# Patient Record
Sex: Female | Born: 1955 | Race: White | Hispanic: No | Smoking: Never smoker
Health system: Southern US, Community
[De-identification: ages and names within clinical notes are randomized; demographics above are authoritative.]

## PROBLEM LIST (undated history)

## (undated) DIAGNOSIS — T8859XA Other complications of anesthesia, initial encounter: Secondary | ICD-10-CM

## (undated) HISTORY — PX: THYROID SURGERY: SHX805

## (undated) HISTORY — PX: OOPHORECTOMY: SHX86

## (undated) HISTORY — PX: THYROIDECTOMY: SHX17

---

## 2017-04-05 DIAGNOSIS — E039 Hypothyroidism, unspecified: Secondary | ICD-10-CM | POA: Insufficient documentation

## 2017-04-05 DIAGNOSIS — H04123 Dry eye syndrome of bilateral lacrimal glands: Secondary | ICD-10-CM | POA: Insufficient documentation

## 2017-04-05 DIAGNOSIS — F419 Anxiety disorder, unspecified: Secondary | ICD-10-CM | POA: Insufficient documentation

## 2017-04-05 DIAGNOSIS — J309 Allergic rhinitis, unspecified: Secondary | ICD-10-CM | POA: Insufficient documentation

## 2017-04-05 DIAGNOSIS — E785 Hyperlipidemia, unspecified: Secondary | ICD-10-CM | POA: Insufficient documentation

## 2017-07-08 DIAGNOSIS — E663 Overweight: Secondary | ICD-10-CM | POA: Insufficient documentation

## 2017-12-06 DIAGNOSIS — F410 Panic disorder [episodic paroxysmal anxiety] without agoraphobia: Secondary | ICD-10-CM | POA: Insufficient documentation

## 2017-12-06 DIAGNOSIS — Z9989 Dependence on other enabling machines and devices: Secondary | ICD-10-CM | POA: Insufficient documentation

## 2017-12-06 DIAGNOSIS — Z0000 Encounter for general adult medical examination without abnormal findings: Secondary | ICD-10-CM | POA: Insufficient documentation

## 2017-12-06 DIAGNOSIS — G4733 Obstructive sleep apnea (adult) (pediatric): Secondary | ICD-10-CM | POA: Insufficient documentation

## 2017-12-06 DIAGNOSIS — E890 Postprocedural hypothyroidism: Secondary | ICD-10-CM | POA: Insufficient documentation

## 2021-05-04 ENCOUNTER — Encounter: Payer: Medicare Other | Admitting: Family Medicine

## 2021-05-04 ENCOUNTER — Other Ambulatory Visit (HOSPITAL_COMMUNITY)
Admission: RE | Admit: 2021-05-04 | Discharge: 2021-05-04 | Disposition: A | Discharge status: Home / Self Care | Payer: Medicare Other | Source: Ambulatory Visit | Attending: Family Medicine | Admitting: Family Medicine

## 2021-05-04 ENCOUNTER — Other Ambulatory Visit: Payer: Self-pay

## 2021-05-04 ENCOUNTER — Encounter: Payer: Self-pay | Admitting: Family Medicine

## 2021-05-04 ENCOUNTER — Ambulatory Visit (INDEPENDENT_AMBULATORY_CARE_PROVIDER_SITE_OTHER): Payer: Medicare Other | Admitting: Family Medicine

## 2021-05-04 VITALS — BP 124/78 | HR 73 | Wt 154.0 lb

## 2021-05-04 DIAGNOSIS — Z1151 Encounter for screening for human papillomavirus (HPV): Secondary | ICD-10-CM | POA: Insufficient documentation

## 2021-05-04 DIAGNOSIS — Z124 Encounter for screening for malignant neoplasm of cervix: Secondary | ICD-10-CM

## 2021-05-04 DIAGNOSIS — R102 Pelvic and perineal pain: Secondary | ICD-10-CM | POA: Diagnosis not present

## 2021-05-04 DIAGNOSIS — Z01419 Encounter for gynecological examination (general) (routine) without abnormal findings: Secondary | ICD-10-CM | POA: Insufficient documentation

## 2021-05-04 DIAGNOSIS — N952 Postmenopausal atrophic vaginitis: Secondary | ICD-10-CM | POA: Diagnosis not present

## 2021-05-04 MED ORDER — PREMARIN 0.625 MG/GM VA CREA
1.0000 | TOPICAL_CREAM | Freq: Every day | VAGINAL | 12 refills | Status: DC
Start: 2021-05-04 — End: 2023-03-12

## 2021-05-04 NOTE — Progress Notes (Signed)
° °  Subjective:    Patient ID: Christine Richardson is a 65 y.o. female presenting with No chief complaint on file.  on 05/04/2021  HPI: Had some pain with intercourse. Felt like knives. Uses baby oil as lubricant.  Feels it at introitus. Unsure if worse with standing. Thought it might be pelvic organ prolapse. Felt somewhat swollen at the time.  Review of Systems  Constitutional:  Negative for chills and fever.  Respiratory:  Negative for shortness of breath.   Cardiovascular:  Negative for chest pain.  Gastrointestinal:  Negative for abdominal pain, nausea and vomiting.  Genitourinary:  Negative for dysuria.  Skin:  Negative for rash.     Objective:    BP 124/78    Pulse 73    Wt 154 lb (69.9 kg)  Physical Exam Constitutional:      General: She is not in acute distress.    Appearance: She is well-developed.  HENT:     Head: Normocephalic and atraumatic.  Eyes:     General: No scleral icterus. Cardiovascular:     Rate and Rhythm: Normal rate.  Pulmonary:     Effort: Pulmonary effort is normal.  Abdominal:     Palpations: Abdomen is soft.  Genitourinary:    Comments: BUS normal, vagina is pale and atrophic, cervix is without lesion, uterus is small and anteverted, no adnexal mass or tenderness. There is good support. Examined upright and supine.   Musculoskeletal:     Cervical back: Neck supple.  Skin:    General: Skin is warm and dry.  Neurological:     Mental Status: She is alert and oriented to person, place, and time.        Assessment & Plan:  Pelvic pain - no evidence of Pelvic organ prolapse - check u/s - Plan: US PELVIC COMPLETE WITH TRANSVAGINAL  Vaginal atrophy - trial of premarin, water based lubricant - Plan: conjugated estrogens (PREMARIN) vaginal cream  Screening for cervical cancer - Should be last pap needed - Plan: Cytology - PAP( [place])  Return in about 7 weeks (around 06/22/2021) for needs U/S.  Reva Bores 05/04/2021 11:28 AM

## 2021-05-06 LAB — CYTOLOGY - PAP
Comment: NEGATIVE
Diagnosis: NEGATIVE
High risk HPV: NEGATIVE

## 2021-05-09 ENCOUNTER — Telehealth: Payer: Self-pay | Admitting: *Deleted

## 2021-05-09 NOTE — Telephone Encounter (Signed)
-----   Message from Reva Bores, MD sent at 05/09/2021  8:57 AM EST ----- Her pap is normal.

## 2021-05-09 NOTE — Telephone Encounter (Signed)
Pt informed of normal pap results.  ?

## 2021-05-13 ENCOUNTER — Ambulatory Visit
Admission: RE | Admit: 2021-05-13 | Discharge: 2021-05-13 | Disposition: A | Discharge status: Home / Self Care | Payer: Medicare Other | Source: Ambulatory Visit | Attending: Family Medicine | Admitting: Family Medicine

## 2021-05-13 ENCOUNTER — Other Ambulatory Visit: Payer: Self-pay

## 2021-05-13 DIAGNOSIS — R102 Pelvic and perineal pain: Secondary | ICD-10-CM | POA: Diagnosis not present

## 2021-05-17 ENCOUNTER — Telehealth: Payer: Self-pay | Admitting: Radiology

## 2021-05-17 NOTE — Telephone Encounter (Signed)
Called and left voicemail to call [facility] to schedule appointment with Dr Shawnie Pons to discuss ultrasound results.

## 2021-05-17 NOTE — Telephone Encounter (Signed)
-----   Message from Clovis Riley, RN sent at 05/17/2021 10:03 AM EST ----- Do you mind calling her to get her in with pratt ----- Message ----- From: Reva Bores, MD Sent: 05/16/2021  11:28 AM EST To: [facility]  Needs appointment to discuss results please

## 2021-05-18 ENCOUNTER — Other Ambulatory Visit: Payer: Self-pay

## 2021-05-18 ENCOUNTER — Encounter: Payer: Self-pay | Admitting: Family Medicine

## 2021-05-18 ENCOUNTER — Other Ambulatory Visit: Payer: Self-pay | Admitting: Family Medicine

## 2021-05-18 ENCOUNTER — Ambulatory Visit: Payer: Medicare Other | Admitting: Family Medicine

## 2021-05-18 ENCOUNTER — Ambulatory Visit (INDEPENDENT_AMBULATORY_CARE_PROVIDER_SITE_OTHER): Payer: Medicare Other | Admitting: Family Medicine

## 2021-05-18 VITALS — BP 119/78 | HR 82

## 2021-05-18 DIAGNOSIS — R9389 Abnormal findings on diagnostic imaging of other specified body structures: Secondary | ICD-10-CM

## 2021-05-19 ENCOUNTER — Encounter: Payer: Self-pay | Admitting: Family Medicine

## 2021-05-19 DIAGNOSIS — R9389 Abnormal findings on diagnostic imaging of other specified body structures: Secondary | ICD-10-CM | POA: Insufficient documentation

## 2021-05-19 NOTE — Assessment & Plan Note (Signed)
S/p EMB will treat based on results Patient given post procedure instructions.

## 2021-05-19 NOTE — Progress Notes (Signed)
° °  Subjective:    Patient ID: Christine Richardson is a 65 y.o. female presenting with Follow-up  on 05/18/2021  HPI: Patient had significant pain with intercourse and came in to be seen. Noted to have vaginal dryness and placed on Premarin. Had u/s for pain as part of w/u and found to have thickened endometrium.  Review of Systems  Constitutional:  Negative for chills and fever.  Respiratory:  Negative for shortness of breath.   Cardiovascular:  Negative for chest pain.  Gastrointestinal:  Negative for abdominal pain, nausea and vomiting.  Genitourinary:  Negative for dysuria.  Skin:  Negative for rash.     Objective:    BP 119/78    Pulse 82  Physical Exam Constitutional:      General: She is not in acute distress.    Appearance: She is well-developed.  HENT:     Head: Normocephalic and atraumatic.  Eyes:     General: No scleral icterus. Cardiovascular:     Rate and Rhythm: Normal rate.  Pulmonary:     Effort: Pulmonary effort is normal.  Abdominal:     Palpations: Abdomen is soft.  Genitourinary:    Comments: BUS normal, vagina is pink and rugated, cervix has a small cervical polyp noted uterus is small and anteverted, no adnexal mass or tenderness.  Musculoskeletal:     Cervical back: Neck supple.  Skin:    General: Skin is warm and dry.  Neurological:     Mental Status: She is alert and oriented to person, place, and time.   Procedure: Patient given informed consent, signed copy in the chart, time out was performed. Appropriate time out taken. . The patient was placed in the lithotomy position and the cervix brought into view with sterile speculum.  Portio of cervix cleansed x 2 with betadine swabs.  A tenaculum was placed in the anterior lip of the cervix.  The uterus was sounded for depth of 7 cm. A pipelle was introduced to into the uterus, suction created,  and an endometrial sample was obtained. All equipment was removed and accounted for.  The patient tolerated the  procedure well.       Assessment & Plan:   Problem List Items Addressed This Visit       Unprioritized   Thickened endometrium - Primary    S/p EMB will treat based on results Patient given post procedure instructions.        Relevant Orders   Surgical pathology( / [facility])    Return in about 4 weeks (around 06/15/2021) for virtual.  Reva Bores 05/19/2021 8:37 AM

## 2021-05-24 ENCOUNTER — Telehealth: Payer: Self-pay | Admitting: *Deleted

## 2021-05-24 NOTE — Telephone Encounter (Signed)
Pt informed of EMB results

## 2021-05-24 NOTE — Telephone Encounter (Signed)
-----   Message from Reva Bores, MD sent at 05/24/2021  3:57 PM EST ----- Her biopsy is consistent with a benign endometrial polyp.

## 2021-07-20 ENCOUNTER — Ambulatory Visit (INDEPENDENT_AMBULATORY_CARE_PROVIDER_SITE_OTHER): Payer: Medicare Other | Admitting: Family Medicine

## 2021-07-20 ENCOUNTER — Encounter: Payer: Self-pay | Admitting: Family Medicine

## 2021-07-20 ENCOUNTER — Other Ambulatory Visit: Payer: Self-pay

## 2021-07-20 DIAGNOSIS — N952 Postmenopausal atrophic vaginitis: Secondary | ICD-10-CM | POA: Diagnosis not present

## 2021-07-20 DIAGNOSIS — R9389 Abnormal findings on diagnostic imaging of other specified body structures: Secondary | ICD-10-CM

## 2021-07-20 NOTE — Assessment & Plan Note (Signed)
On Premarin, vagina appears well supported. ?

## 2021-07-20 NOTE — Progress Notes (Signed)
? ?  Subjective:  ? ? Patient ID: Christine Richardson is a 66 y.o. female presenting with Follow-up ? on 07/20/2021 ? ?HPI: ?Here for f/u. Had initially come in for dyspareunia, thought to be related to vaginal atrophy. U/s done showed thickened endometrium immediately after that visit. Biopsy done showed benign polyp. Using the premarin cream and no more painful intercourse, but now with some intermittent spotting. ? ?Review of Systems  ?Constitutional:  Negative for chills and fever.  ?Respiratory:  Negative for shortness of breath.   ?Cardiovascular:  Negative for chest pain.  ?Gastrointestinal:  Negative for abdominal pain, nausea and vomiting.  ?Genitourinary:  Negative for dysuria.  ?Skin:  Negative for rash.  ?   ?Objective:  ?  ?BP 125/83   Pulse 62   Resp 16   Ht 5' 3.5" (1.613 m)   Wt 154 lb (69.9 kg)   BMI 26.85 kg/m?  ?Physical Exam ?Exam conducted with a chaperone present.  ?Constitutional:   ?   General: She is not in acute distress. ?   Appearance: She is well-developed.  ?HENT:  ?   Head: Normocephalic and atraumatic.  ?Eyes:  ?   General: No scleral icterus. ?Cardiovascular:  ?   Rate and Rhythm: Normal rate.  ?Pulmonary:  ?   Effort: Pulmonary effort is normal.  ?Abdominal:  ?   Palpations: Abdomen is soft.  ?Genitourinary: ?   Comments: Vagina is pink ?Musculoskeletal:  ?   Cervical back: Neck supple.  ?Skin: ?   General: Skin is warm and dry.  ?Neurological:  ?   Mental Status: She is alert and oriented to person, place, and time.  ? ? ? ?   ?Assessment & Plan:  ? ?Problem List Items Addressed This Visit   ? ?  ? Unprioritized  ? Vaginal atrophy  ?  On Premarin, vagina appears well supported. ?  ?  ? Thickened endometrium  ?  Intermittent spotting, if persists, consider surgical intervention. Recommend decreasing premairn to 2x/wk. ?  ?  ? ? ? ?Return in about 3 months (around 10/20/2021). ? ?Reva Bores, MD ?07/20/2021 ?11:03 AM ? ? ?

## 2021-07-20 NOTE — Assessment & Plan Note (Signed)
Intermittent spotting, if persists, consider surgical intervention. Recommend decreasing premairn to 2x/wk. ?

## 2021-09-01 DIAGNOSIS — M25511 Pain in right shoulder: Secondary | ICD-10-CM | POA: Insufficient documentation

## 2021-09-01 DIAGNOSIS — M25611 Stiffness of right shoulder, not elsewhere classified: Secondary | ICD-10-CM | POA: Insufficient documentation

## 2021-10-27 ENCOUNTER — Other Ambulatory Visit: Payer: Self-pay | Admitting: Family Medicine

## 2021-10-27 DIAGNOSIS — Z780 Asymptomatic menopausal state: Secondary | ICD-10-CM

## 2021-10-28 ENCOUNTER — Other Ambulatory Visit: Payer: Self-pay | Admitting: Family Medicine

## 2021-10-28 DIAGNOSIS — Z1231 Encounter for screening mammogram for malignant neoplasm of breast: Secondary | ICD-10-CM

## 2021-10-28 DIAGNOSIS — Z780 Asymptomatic menopausal state: Secondary | ICD-10-CM

## 2021-10-31 ENCOUNTER — Other Ambulatory Visit: Payer: Self-pay | Admitting: Family Medicine

## 2021-10-31 ENCOUNTER — Other Ambulatory Visit (HOSPITAL_COMMUNITY): Payer: Self-pay | Admitting: Family Medicine

## 2021-10-31 DIAGNOSIS — E785 Hyperlipidemia, unspecified: Secondary | ICD-10-CM

## 2021-11-16 ENCOUNTER — Ambulatory Visit
Admission: RE | Admit: 2021-11-16 | Discharge: 2021-11-16 | Disposition: A | Discharge status: Home / Self Care | Payer: Medicare Other | Source: Ambulatory Visit | Attending: Family Medicine | Admitting: Family Medicine

## 2021-11-16 DIAGNOSIS — E785 Hyperlipidemia, unspecified: Secondary | ICD-10-CM | POA: Diagnosis present

## 2021-12-28 ENCOUNTER — Other Ambulatory Visit: Payer: Medicare Other

## 2022-02-16 ENCOUNTER — Ambulatory Visit
Admission: RE | Admit: 2022-02-16 | Discharge: 2022-02-16 | Disposition: A | Discharge status: Home / Self Care | Payer: Medicare Other | Source: Ambulatory Visit | Attending: Family Medicine | Admitting: Family Medicine

## 2022-02-16 DIAGNOSIS — Z780 Asymptomatic menopausal state: Secondary | ICD-10-CM | POA: Diagnosis present

## 2022-02-16 DIAGNOSIS — Z1231 Encounter for screening mammogram for malignant neoplasm of breast: Secondary | ICD-10-CM | POA: Insufficient documentation

## 2022-02-22 ENCOUNTER — Inpatient Hospital Stay
Admission: RE | Admit: 2022-02-22 | Discharge: 2022-02-22 | Disposition: A | Discharge status: Home / Self Care | Payer: Self-pay | Source: Ambulatory Visit | Attending: *Deleted | Admitting: *Deleted

## 2022-02-22 ENCOUNTER — Other Ambulatory Visit: Payer: Self-pay | Admitting: *Deleted

## 2022-02-22 DIAGNOSIS — Z1231 Encounter for screening mammogram for malignant neoplasm of breast: Secondary | ICD-10-CM

## 2022-11-25 IMAGING — US US PELVIS COMPLETE WITH TRANSVAGINAL
1 series · 13 of 25 positions shown · non-contrast
Comparison: None

CLINICAL DATA: Pelvic pain for 6 weeks



[Series 1: us pelvic complete with transvaginal · 61 acquisitions, 13 frames shown]
[im 1/61]
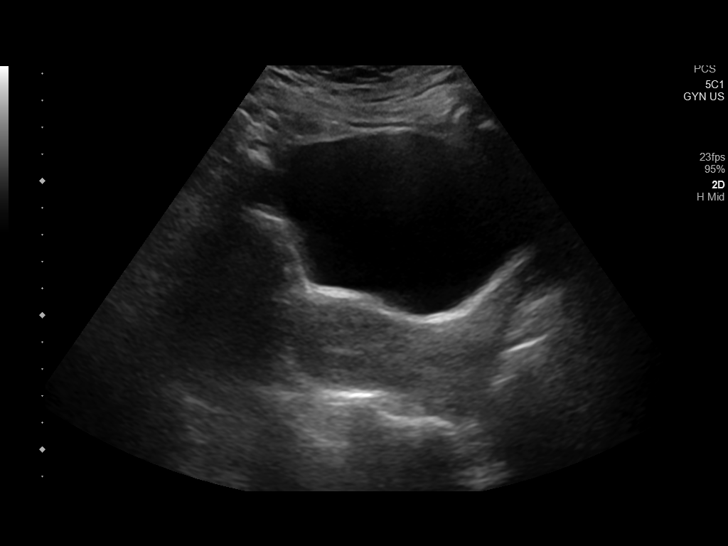
[im 6/61]
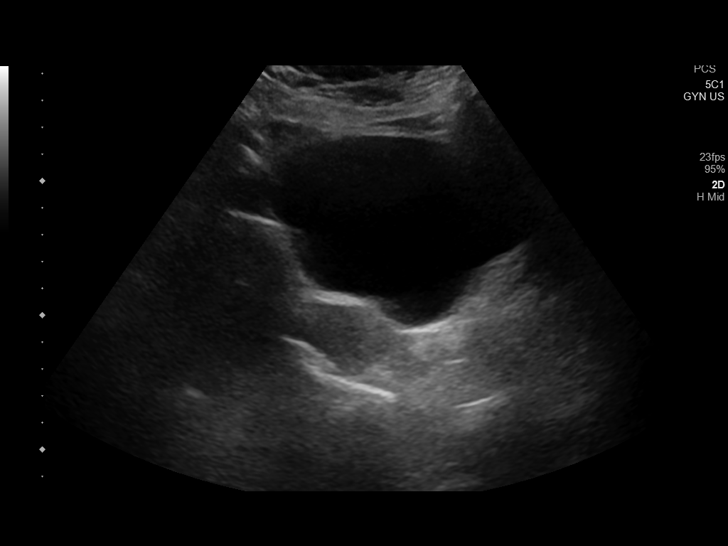
[im 11/61]
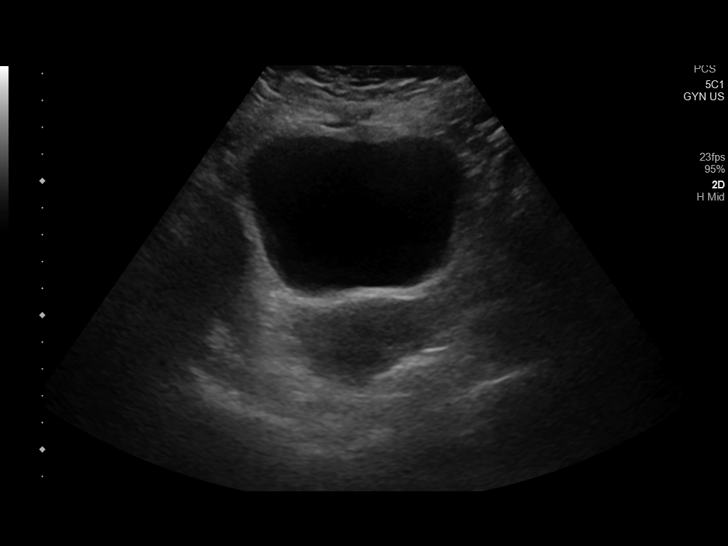
[im 16/61]
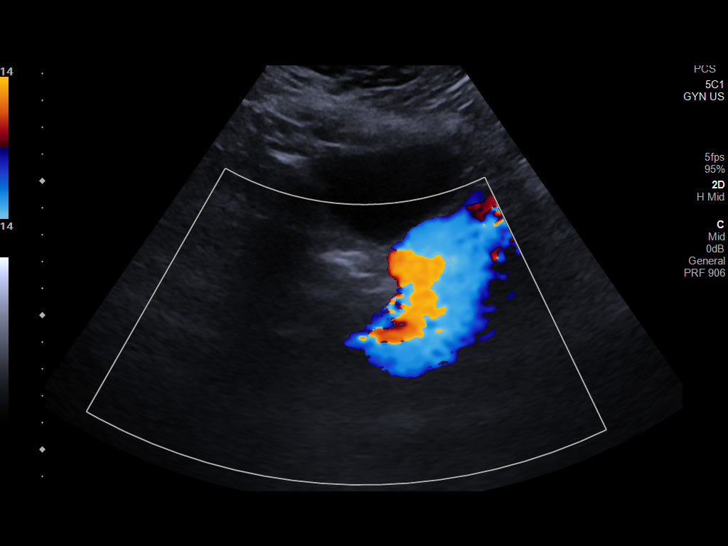
[im 21/61]
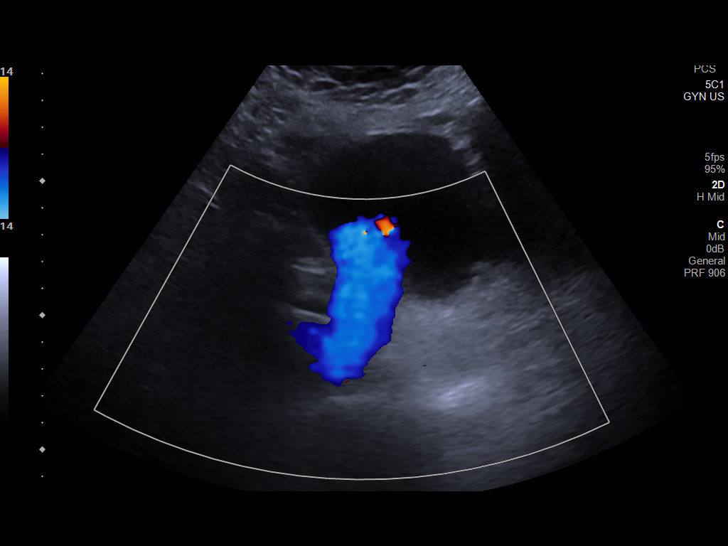
[im 26/61]
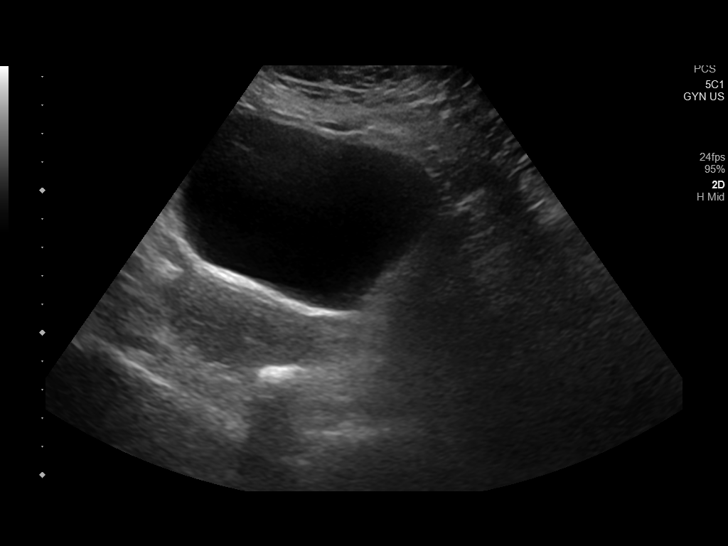
[im 31/61]
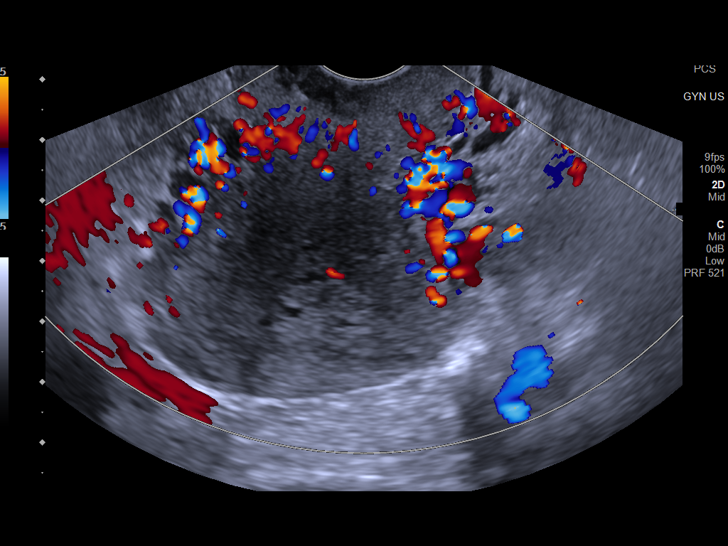
[im 36/61]
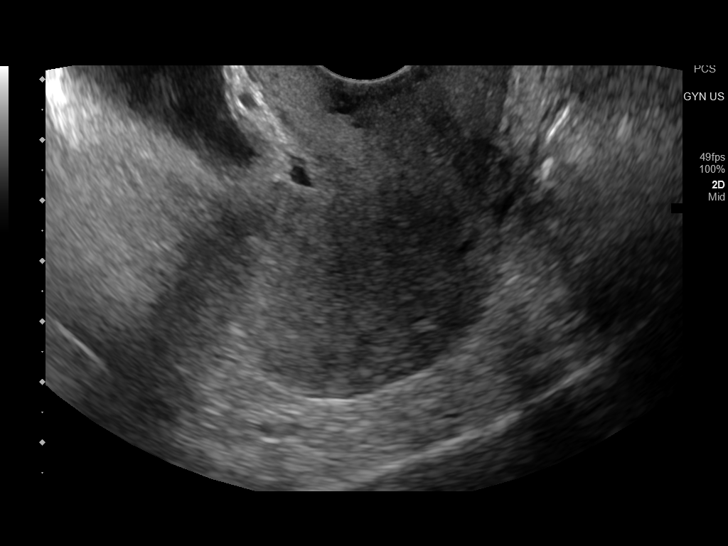
[im 41/61]
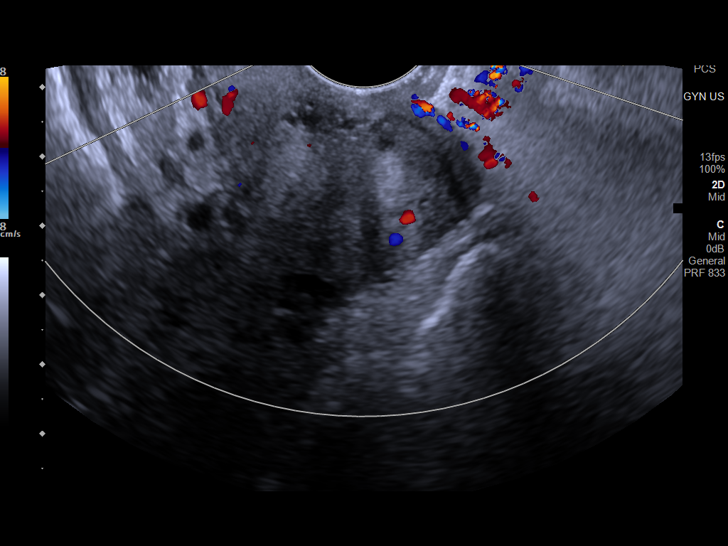
[im 46/61]
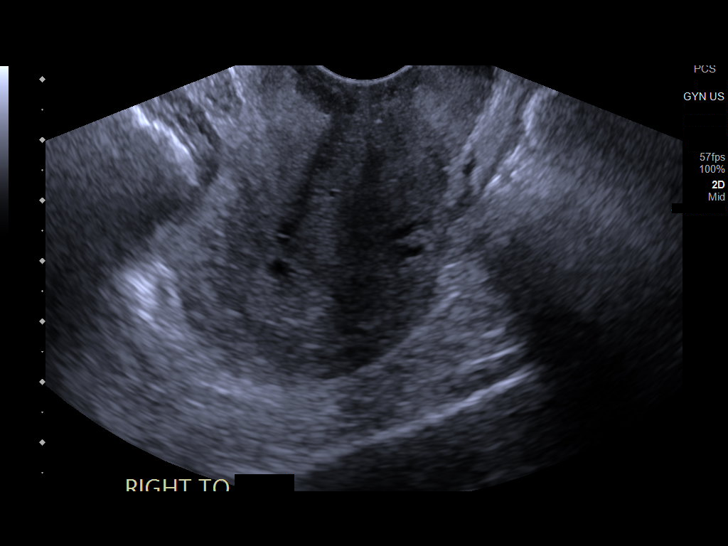
[im 51/61]
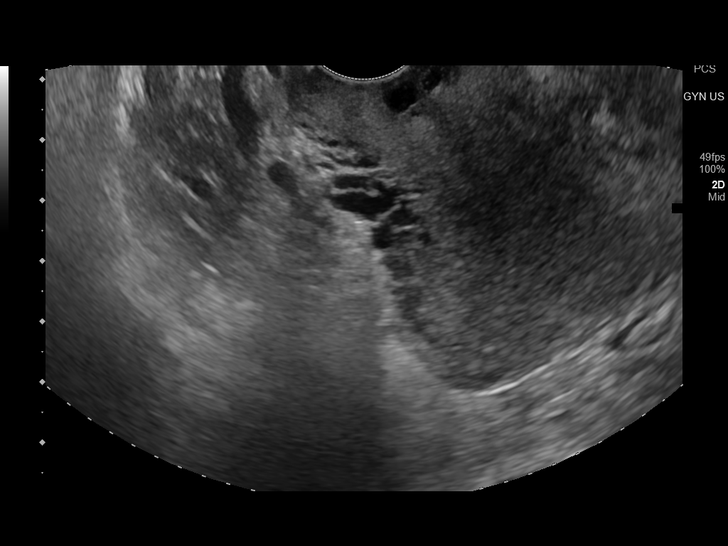
[im 56/61]
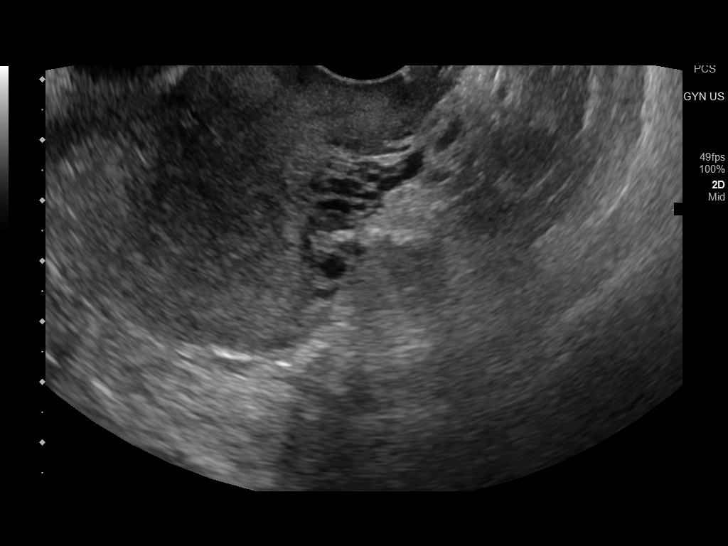
[im 61/61]
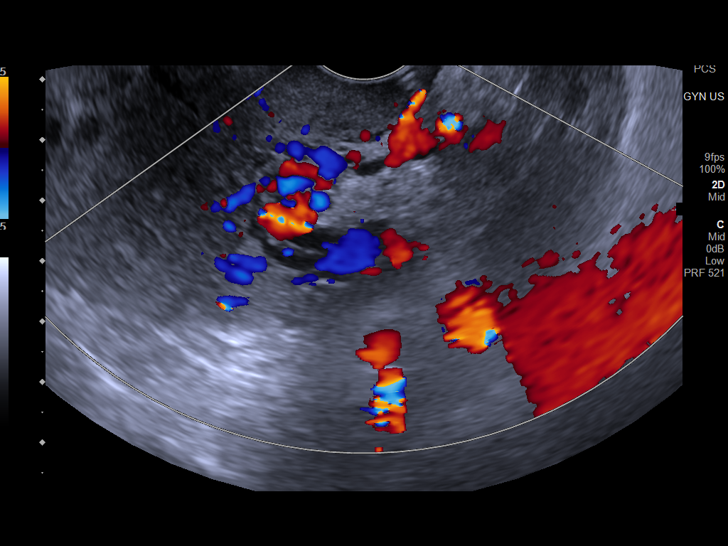

[13 of 25 positions shown; findings below may reference images not displayed]

FINDINGS: Uterus

Measurements: 7.0 x 3.6 x 4.8 cm. = volume: 63 mL. No fibroids or
other mass visualized.

Endometrium

Thickness: 12.8 mm. A complex hypoechoic region is noted in the
endometrium greater than that expected for a patient of this age.
Some color flow is noted along the peripheral aspect of the
endometrial abnormality.

Right ovary

Surgically removed

Left ovary

Not well visualized

Other findings

No abnormal free fluid.
IMPRESSION: Prominent endometrium at 12.8 mm with a complex predominately
hypoechoic area identified with some color flow along its edge.
Endometrial thickness is considered abnormal for an asymptomatic
post-menopausal female. Endometrial sampling should be considered to
exclude carcinoma.

No other focal abnormality is noted.

These results will be called to the ordering clinician or
representative by the Radiologist Assistant, and communication
documented in the PACS or [REDACTED].

## 2023-03-12 ENCOUNTER — Ambulatory Visit (INDEPENDENT_AMBULATORY_CARE_PROVIDER_SITE_OTHER): Payer: Medicare Other | Admitting: Physician Assistant

## 2023-03-12 ENCOUNTER — Encounter: Payer: Self-pay | Admitting: Physician Assistant

## 2023-03-12 VITALS — BP 92/57 | HR 71 | Temp 97.7°F | Ht 63.5 in | Wt 157.6 lb

## 2023-03-12 DIAGNOSIS — R933 Abnormal findings on diagnostic imaging of other parts of digestive tract: Secondary | ICD-10-CM

## 2023-03-12 DIAGNOSIS — R197 Diarrhea, unspecified: Secondary | ICD-10-CM | POA: Diagnosis not present

## 2023-03-12 MED ORDER — PEG 3350-KCL-NA BICARB-NACL 420 G PO SOLR: 4000.0000 mL | Freq: Once | ORAL | 0 refills | Status: AC

## 2023-03-12 NOTE — Progress Notes (Signed)
Celso Amy, PA-C [address]  [address]  [place], [place] [address]  Main: [phone]  Fax: [phone]   Gastroenterology Consultation  Referring Provider:     Orpha Bur, MD Primary Care Physician:  Orpha Bur, MD Primary Gastroenterologist:  Celso Amy, PA-C / Dr. Midge Minium   Reason for Consultation:     Diarrhea        HPI:   Christine Richardson is a 67 y.o. y/o female referred for consultation & management  by Orpha Bur, MD.    CT abdomen pelvis with contrast done 03/05/2023 (to evaluate RLQ pain and diarrhea) showed questionable mild proximal colitis which could be infectious or inflammatory.  Mild: Stool burden.  Normal appendix.  GI follow-up was recommended.  Patient states she has had diarrhea for several months.  She has had extensive workup from her PCP including negative stool studies, and normal labs including negative celiac labs.  Patient states her PCP recommended that she have a colonoscopy.  Patient denies rectal bleeding or unintentional weight loss.  Admits to mild weight gain.  Has mild intermittent lower abdominal cramping which comes and goes.  No current abdominal pain today.  She denies any recent antibiotic use.  Diarrhea started several months ago after she was started on Zetia for cholesterol.  Patient took Zetia for 7weeks and then discontinued, which has not helped her diarrhea.  She is having 3 or 4 loose bowel movements every morning.  She eats very healthy diet.  She reports her last colonoscopy done in 2020 in [place] showed 1 small polyp removed.  She reports getting colonoscopy every 5 years in [place].  No personal history or family history of IBD.  Her father had celiac.  Sister passed away from liver cancer.  Her mother had colon cancer in her 41s.  History reviewed. No pertinent past medical history.  Past Surgical History:  Procedure Laterality Date   OOPHORECTOMY Right    THYROID SURGERY     THYROIDECTOMY      Prior  to Admission medications   Medication Sig Start Date End Date Taking? Authorizing Provider  conjugated estrogens (PREMARIN) vaginal cream Place 1 Applicatorful vaginally daily. 05/04/21   Reva Bores, MD  cycloSPORINE (RESTASIS) 0.05 % ophthalmic emulsion Place 1 drop into both eyes 2 (two) times daily.    [provider]  [facility] Lanius (OMEGA-3) 500 MG CAPS Take by mouth.    [provider]  levothyroxine (SYNTHROID) 75 MCG tablet Take 75 mcg by mouth daily before breakfast.    [provider]  vitamin B-12 (CYANOCOBALAMIN) 1000 MCG tablet Take 1,000 mcg by mouth daily.    [provider]  Vitamin D, Cholecalciferol, 25 MCG (1000 UT) CAPS Take by mouth.    [provider]    Family History  Problem Relation Age of Onset   Colon cancer Mother    Rheum arthritis Mother      Social History   Tobacco Use   Smoking status: Never   Smokeless tobacco: Never  Vaping Use   Vaping status: Never Used  Substance Use Topics   Alcohol use: Yes    Alcohol/week: 1.0 standard drink of alcohol    Types: 1 Glasses of wine per week   Drug use: Never    Allergies as of 03/12/2023 - Review Complete 03/12/2023  Allergen Reaction Noted   Penicillins Rash, Hives, and Other (See Comments) 05/04/2021   Sulfate Rash 05/04/2021    Review of Systems:  All systems reviewed and negative except where noted in HPI.   Physical Exam:  BP (!) 92/57   Pulse 71   Temp 97.7 F (36.5 C)   Ht 5' 3.5" (1.613 m)   Wt 157 lb 9.6 oz (71.5 kg)   BMI 27.48 kg/m  No LMP recorded. Patient is postmenopausal.  General:   Alert,  Well-developed, well-nourished, pleasant and cooperative in NAD Lungs:  Respirations even and unlabored.  Clear throughout to auscultation.   No wheezes, crackles, or rhonchi. No acute distress. Heart:  Regular rate and rhythm; no murmurs, clicks, rubs, or gallops. Abdomen:  Normal bowel sounds.  No bruits.  Soft, and non-distended without  masses, hepatosplenomegaly or hernias noted.  No Tenderness.  No guarding or rebound tenderness.    Neurologic:  Alert and oriented x3;  grossly normal neurologically. Psych:  Alert and cooperative. Normal mood and affect.  Imaging Studies: No results found.  Assessment and Plan:   Aerith Carreira is a 67 y.o. y/o female has been referred for:   1.  Diarrhea  Requesting recent stool test results and lab results from her PCP.  Patient states everything was normal.  Recent negative stool studies and negative celiac labs.  2.  Abnormal CT scan of the colon 03/05/2023: Questionable mild proximal colitis  Scheduling Colonoscopy I discussed risks of colonoscopy with patient to include risk of bleeding, colon perforation, and risk of sedation.  Patient expressed understanding and agrees to proceed with colonoscopy.   Follow up As Needed based on Colonoscopy results and GI symptoms.  Celso Amy, PA-C

## 2023-03-13 ENCOUNTER — Telehealth: Payer: Self-pay | Admitting: Physician Assistant

## 2023-03-13 NOTE — Telephone Encounter (Signed)
Medical records request were sent  to [facility] from [place] [facility] 03/13/2023 All notes and labs

## 2023-04-04 ENCOUNTER — Encounter: Payer: Self-pay | Admitting: Gastroenterology

## 2023-04-04 ENCOUNTER — Other Ambulatory Visit: Payer: Self-pay

## 2023-04-10 ENCOUNTER — Encounter: Payer: Self-pay | Admitting: Gastroenterology

## 2023-04-11 ENCOUNTER — Encounter: Payer: Self-pay | Admitting: Gastroenterology

## 2023-04-11 ENCOUNTER — Other Ambulatory Visit: Payer: Self-pay

## 2023-04-11 ENCOUNTER — Ambulatory Visit
Admission: RE | Admit: 2023-04-11 | Discharge: 2023-04-11 | Disposition: A | Discharge status: Home / Self Care | Payer: Medicare Other | Attending: Gastroenterology | Admitting: Gastroenterology

## 2023-04-11 ENCOUNTER — Ambulatory Visit: Payer: Medicare Other | Admitting: Anesthesiology

## 2023-04-11 ENCOUNTER — Encounter
Admission: RE | Disposition: A | Discharge status: Home / Self Care | Payer: Self-pay | Source: Home / Self Care | Attending: Gastroenterology

## 2023-04-11 DIAGNOSIS — K529 Noninfective gastroenteritis and colitis, unspecified: Secondary | ICD-10-CM | POA: Insufficient documentation

## 2023-04-11 DIAGNOSIS — F419 Anxiety disorder, unspecified: Secondary | ICD-10-CM | POA: Insufficient documentation

## 2023-04-11 DIAGNOSIS — E890 Postprocedural hypothyroidism: Secondary | ICD-10-CM | POA: Insufficient documentation

## 2023-04-11 DIAGNOSIS — Z800 Family history of malignant neoplasm of digestive organs: Secondary | ICD-10-CM | POA: Insufficient documentation

## 2023-04-11 DIAGNOSIS — R197 Diarrhea, unspecified: Secondary | ICD-10-CM

## 2023-04-11 HISTORY — DX: Other complications of anesthesia, initial encounter: T88.59XA

## 2023-04-11 HISTORY — PX: BIOPSY: SHX5522

## 2023-04-11 HISTORY — PX: COLONOSCOPY WITH PROPOFOL: SHX5780

## 2023-04-11 SURGERY — COLONOSCOPY WITH PROPOFOL
Anesthesia: General

## 2023-04-11 MED ORDER — PROPOFOL 10 MG/ML IV BOLUS
INTRAVENOUS | Status: AC
  Filled 2023-04-11: qty 20

## 2023-04-11 MED ORDER — PROPOFOL 1000 MG/100ML IV EMUL
INTRAVENOUS | Status: AC
  Filled 2023-04-11: qty 100

## 2023-04-11 MED ORDER — LIDOCAINE HCL (CARDIAC) PF 100 MG/5ML IV SOSY
PREFILLED_SYRINGE | INTRAVENOUS | Status: DC | PRN
  Administered 2023-04-11: 50 mg via INTRAVENOUS

## 2023-04-11 MED ORDER — SODIUM CHLORIDE 0.9 % IV SOLN: INTRAVENOUS | Status: DC

## 2023-04-11 MED ORDER — PROPOFOL 10 MG/ML IV BOLUS
INTRAVENOUS | Status: DC | PRN
  Administered 2023-04-11: 80 mg via INTRAVENOUS
  Administered 2023-04-11: 120 ug/kg/min via INTRAVENOUS

## 2023-04-11 NOTE — Transfer of Care (Signed)
Immediate Anesthesia Transfer of Care Note  Patient: Christine Richardson  Procedure(s) Performed: COLONOSCOPY WITH PROPOFOL BIOPSY  Patient Location: PACU and Endoscopy Unit  Anesthesia Type:General  Level of Consciousness: drowsy and patient cooperative  Airway & Oxygen Therapy: Patient Spontanous Breathing  Post-op Assessment: Report given to RN and Post -op Vital signs reviewed and stable  Post vital signs: Reviewed and stable  Last Vitals:  Vitals Value Taken Time  BP 95/64 04/11/23 0948  Temp 35.6 C 04/11/23 0948  Pulse 63 04/11/23 0952  Resp 19 04/11/23 0952  SpO2 98 % 04/11/23 0952  Vitals shown include unfiled device data.  Last Pain:  Vitals:   04/11/23 0948  TempSrc: Temporal  PainSc:          Complications: No notable events documented.

## 2023-04-11 NOTE — Anesthesia Preprocedure Evaluation (Signed)
Anesthesia Evaluation  Patient identified by MRN, date of birth, ID band Patient awake    Reviewed: Allergy & Precautions, NPO status , Patient's Chart, lab work & pertinent test results  Airway Mallampati: III  TM Distance: >3 FB Neck ROM: full    Dental  (+) Chipped   Pulmonary neg pulmonary ROS   Pulmonary exam normal        Cardiovascular negative cardio ROS Normal cardiovascular exam     Neuro/Psych  PSYCHIATRIC DISORDERS Anxiety     negative neurological ROS     GI/Hepatic negative GI ROS, Neg liver ROS,,,  Endo/Other  Hypothyroidism    Renal/GU negative Renal ROS  negative genitourinary   Musculoskeletal   Abdominal   Peds  Hematology negative hematology ROS (+)   Anesthesia Other Findings Past Medical History: No date: Complication of anesthesia  Past Surgical History: No date: OOPHORECTOMY; Right No date: THYROID SURGERY No date: THYROIDECTOMY  BMI    Body Mass Index: 27.28 kg/m      Reproductive/Obstetrics negative OB ROS                             Anesthesia Physical Anesthesia Plan  ASA: 2  Anesthesia Plan: General   Post-op Pain Management: Minimal or no pain anticipated   Induction: Intravenous  PONV Risk Score and Plan: 3 and Propofol infusion, TIVA and Ondansetron  Airway Management Planned: Nasal Cannula  Additional Equipment: None  Intra-op Plan:   Post-operative Plan:   Informed Consent: I have reviewed the patients History and Physical, chart, labs and discussed the procedure including the risks, benefits and alternatives for the proposed anesthesia with the patient or authorized representative who has indicated his/her understanding and acceptance.     Dental advisory given  Plan Discussed with: CRNA and Surgeon  Anesthesia Plan Comments: (Discussed risks of anesthesia with patient, including possibility of difficulty with spontaneous  ventilation under anesthesia necessitating airway intervention, PONV, and rare risks such as cardiac or respiratory or neurological events, and allergic reactions. Discussed the role of CRNA in patient's perioperative care. Patient understands.)       Anesthesia Quick Evaluation

## 2023-04-11 NOTE — Op Note (Signed)
[facility] Gastroenterology Patient Name: Christine Richardson Procedure Date: 04/11/2023 9:22 AM MRN: [id number] Account #: [id number] Date of Birth: 01-12-1956 Admit Type: Outpatient Age: 67 Room: [facility] [place] [facility] ENDO ROOM 4 Gender: Female Note Status: Finalized Instrument Name: Prentice Docker [id number] Procedure:             Colonoscopy Indications:           Chronic diarrhea Providers:             Midge Minium MD, MD Referring MD:          No Local Md, MD (Referring MD) Medicines:             Propofol per Anesthesia Complications:         No immediate complications. Procedure:             Pre-Anesthesia Assessment:                        - Prior to the procedure, a History and Physical was                         performed, and patient medications and allergies were                         reviewed. The patient's tolerance of previous                         anesthesia was also reviewed. The risks and benefits                         of the procedure and the sedation options and risks                         were discussed with the patient. All questions were                         answered, and informed consent was obtained. Prior                         Anticoagulants: The patient has taken no anticoagulant                         or antiplatelet agents. ASA Grade Assessment: II - A                         patient with mild systemic disease. After reviewing                         the risks and benefits, the patient was deemed in                         satisfactory condition to undergo the procedure.                        After obtaining informed consent, the colonoscope was                         passed under direct vision. Throughout the procedure,  the patient's blood pressure, pulse, and oxygen                         saturations were monitored continuously. The                         Colonoscope was introduced through the anus and                          advanced to the the terminal ileum. The colonoscopy                         was performed without difficulty. The patient                         tolerated the procedure well. The quality of the bowel                         preparation was excellent. Findings:      The perianal and digital rectal examinations were normal.      The terminal ileum appeared normal. Biopsies were taken with a cold       forceps for histology.      The colon (entire examined portion) appeared normal. Biopsies were taken       with a cold forceps for histology. Impression:            - The examined portion of the ileum was normal.                         Biopsied.                        - The entire examined colon is normal. Biopsied. Recommendation:        - Discharge patient to home.                        - Resume previous diet.                        - Continue present medications.                        - Await pathology results. Procedure Code(s):     --- Professional ---                        [phone], Colonoscopy, flexible; with biopsy, single or                         multiple Diagnosis Code(s):     --- Professional ---                        K52.9, Noninfective gastroenteritis and colitis,                         unspecified CPT copyright 2022 American Medical Association. All rights reserved. The codes documented in this report are preliminary and upon coder review may  be revised to meet current compliance requirements. Midge Minium MD, MD 04/11/2023 9:46:08 AM This report has been signed electronically. Number of Addenda: 0  Note Initiated On: 04/11/2023 9:22 AM Scope Withdrawal Time: 0 hours 8 minutes 43 seconds  Total Procedure Duration: 0 hours 16 minutes 4 seconds  Estimated Blood Loss:  Estimated blood loss: none.      [facility]

## 2023-04-11 NOTE — Anesthesia Postprocedure Evaluation (Signed)
Anesthesia Post Note  Patient: Christine Richardson  Procedure(s) Performed: COLONOSCOPY WITH PROPOFOL BIOPSY  Patient location during evaluation: PACU Anesthesia Type: General Level of consciousness: awake and alert Pain management: pain level controlled Vital Signs Assessment: post-procedure vital signs reviewed and stable Respiratory status: spontaneous breathing, nonlabored ventilation, respiratory function stable and patient connected to nasal cannula oxygen Cardiovascular status: blood pressure returned to baseline and stable Postop Assessment: no apparent nausea or vomiting Anesthetic complications: no  No notable events documented.   Last Vitals:  Vitals:   04/11/23 0948 04/11/23 0958  BP: 95/64 111/72  Pulse: 68 65  Resp: 18 (!) 21  Temp: (!) 35.6 C   SpO2: 96% 98%    Last Pain:  Vitals:   04/11/23 0958  TempSrc:   PainSc: 0-No pain                 Stephanie Coup

## 2023-04-11 NOTE — H&P (Signed)
Midge Minium, MD [facility] [address] [place], [place] [address] Phone:[phone] Fax : [phone]  Primary Care Physician:  Orpha Bur, MD Primary Gastroenterologist:  Dr. Servando Snare  Pre-Procedure History & Physical: HPI:  Christine Richardson is a 67 y.o. female is here for an colonoscopy.   Past Medical History:  Diagnosis Date   Complication of anesthesia     Past Surgical History:  Procedure Laterality Date   OOPHORECTOMY Right    THYROID SURGERY     THYROIDECTOMY      Prior to Admission medications   Medication Sig Start Date End Date Taking? Authorizing Provider  Boris Lown Oil (OMEGA-3) 500 MG CAPS Take by mouth.   Yes [provider]  levothyroxine (SYNTHROID) 75 MCG tablet Take 75 mcg by mouth daily before breakfast.   Yes [provider]  progesterone (ENDOMETRIN) 100 MG vaginal insert Place 100 mg vaginally 2 (two) times daily.   Yes [provider]  traZODone (DESYREL) 100 MG tablet Take 100 mg by mouth at bedtime.   Yes [provider]  vitamin B-12 (CYANOCOBALAMIN) 1000 MCG tablet Take 1,000 mcg by mouth daily.   Yes [provider]  Vitamin D, Cholecalciferol, 25 MCG (1000 UT) CAPS Take by mouth.   Yes [provider]    Allergies as of 03/12/2023 - Review Complete 03/12/2023  Allergen Reaction Noted   Penicillins Rash, Hives, and Other (See Comments) 05/04/2021   Sulfate Rash 05/04/2021    Family History  Problem Relation Age of Onset   Colon cancer Mother    Rheum arthritis Mother     Social History   Socioeconomic History   Marital status: Unknown    Spouse name: Not on file   Number of children: Not on file   Years of education: Not on file   Highest education level: Not on file  Occupational History   Not on file  Tobacco Use   Smoking status: Never   Smokeless tobacco: Never  Vaping Use   Vaping status: Never Used  Substance and Sexual Activity   Alcohol use: Yes    Alcohol/week:  1.0 standard drink of alcohol    Types: 1 Glasses of wine per week   Drug use: Never   Sexual activity: Yes    Partners: Male  Other Topics Concern   Not on file  Social History Narrative   Not on file   Social Determinants of Health   Financial Resource Strain: Not on file  Food Insecurity: Not on file  Transportation Needs: Not on file  Physical Activity: Not on file  Stress: Not on file  Social Connections: Not on file  Intimate Partner Violence: Not on file    Review of Systems: See HPI, otherwise negative ROS  Physical Exam: BP 112/77   Pulse 77   Temp (!) 97 F (36.1 C) (Temporal)   Ht 5\' 3"  (1.6 m)   Wt 69.9 kg   SpO2 100%   BMI 27.28 kg/m  General:   Alert,  pleasant and cooperative in NAD Head:  Normocephalic and atraumatic. Neck:  Supple; no masses or thyromegaly. Lungs:  Clear throughout to auscultation.    Heart:  Regular rate and rhythm. Abdomen:  Soft, nontender and nondistended. Normal bowel sounds, without guarding, and without rebound.   Neurologic:  Alert and  oriented x4;  grossly normal neurologically.  Impression/Plan: Christine Richardson is here for an colonoscopy to be performed for diarrhea  Risks, benefits, limitations, and alternatives regarding  colonoscopy  have been reviewed with the patient.  Questions have been answered.  All parties agreeable.   Midge Minium, MD  04/11/2023, 9:15 AM

## 2023-04-12 ENCOUNTER — Encounter: Payer: Self-pay | Admitting: Gastroenterology

## 2023-04-16 LAB — SURGICAL PATHOLOGY

## 2023-04-23 ENCOUNTER — Other Ambulatory Visit
Admission: RE | Admit: 2023-04-23 | Discharge: 2023-04-23 | Disposition: A | Discharge status: Home / Self Care | Payer: Self-pay | Source: Ambulatory Visit | Attending: Medical Genetics | Admitting: Medical Genetics

## 2023-04-23 ENCOUNTER — Other Ambulatory Visit: Payer: Self-pay | Admitting: Medical Genetics

## 2023-04-25 ENCOUNTER — Other Ambulatory Visit: Payer: Self-pay | Admitting: Medical Genetics

## 2023-04-30 ENCOUNTER — Other Ambulatory Visit: Payer: Self-pay

## 2023-05-08 LAB — GENECONNECT MOLECULAR SCREEN: Genetic Analysis Overall Interpretation: NEGATIVE

## 2023-10-17 ENCOUNTER — Other Ambulatory Visit: Payer: Self-pay

## 2023-10-17 ENCOUNTER — Emergency Department

## 2023-10-17 ENCOUNTER — Emergency Department
Admission: EM | Admit: 2023-10-17 | Discharge: 2023-10-18 | Disposition: A | Discharge status: Home / Self Care | Attending: Emergency Medicine | Admitting: Emergency Medicine

## 2023-10-17 DIAGNOSIS — R5383 Other fatigue: Secondary | ICD-10-CM

## 2023-10-17 DIAGNOSIS — R079 Chest pain, unspecified: Secondary | ICD-10-CM

## 2023-10-17 DIAGNOSIS — R2681 Unsteadiness on feet: Secondary | ICD-10-CM | POA: Insufficient documentation

## 2023-10-17 DIAGNOSIS — E079 Disorder of thyroid, unspecified: Secondary | ICD-10-CM | POA: Insufficient documentation

## 2023-10-17 DIAGNOSIS — R0602 Shortness of breath: Secondary | ICD-10-CM | POA: Diagnosis not present

## 2023-10-17 DIAGNOSIS — J341 Cyst and mucocele of nose and nasal sinus: Secondary | ICD-10-CM | POA: Insufficient documentation

## 2023-10-17 DIAGNOSIS — R519 Headache, unspecified: Secondary | ICD-10-CM

## 2023-10-17 DIAGNOSIS — R42 Dizziness and giddiness: Secondary | ICD-10-CM | POA: Diagnosis not present

## 2023-10-17 LAB — LIPASE, BLOOD: Lipase: 33 U/L (ref 11–51)

## 2023-10-17 LAB — BASIC METABOLIC PANEL WITH GFR
Anion gap: 11 (ref 5–15)
BUN: 15 mg/dL (ref 8–23)
CO2: 27 mmol/L (ref 22–32)
Calcium: 9.5 mg/dL (ref 8.9–10.3)
Chloride: 105 mmol/L (ref 98–111)
Creatinine, Ser: 0.76 mg/dL (ref 0.44–1.00)
GFR, Estimated: >60 mL/min (ref >60)
Glucose, Bld: 99 mg/dL (ref 70–99)
Potassium: 4.3 mmol/L (ref 3.5–5.1)
Sodium: 143 mmol/L (ref 135–145)

## 2023-10-17 LAB — URINALYSIS, W/ REFLEX TO CULTURE (INFECTION SUSPECTED)
Bilirubin Urine: NEGATIVE
Glucose, UA: NEGATIVE mg/dL
Hgb urine dipstick: NEGATIVE
Ketones, ur: NEGATIVE mg/dL
Leukocytes,Ua: NEGATIVE
Nitrite: NEGATIVE
Protein, ur: NEGATIVE mg/dL
Specific Gravity, Urine: 1.033 — ABNORMAL HIGH (ref 1.005–1.030)
pH: 6 (ref 5.0–8.0)

## 2023-10-17 LAB — CBC
HCT: 42.6 % (ref 36.0–46.0)
Hemoglobin: 14.7 g/dL (ref 12.0–15.0)
MCH: 30.9 pg (ref 26.0–34.0)
MCHC: 34.5 g/dL (ref 30.0–36.0)
MCV: 89.5 fL (ref 80.0–100.0)
Platelets: 226 10*3/uL (ref 150–400)
RBC: 4.76 MIL/uL (ref 3.87–5.11)
RDW: 11.9 % (ref 11.5–15.5)
WBC: 7.2 10*3/uL (ref 4.0–10.5)
nRBC: 0 % (ref 0.0–0.2)

## 2023-10-17 LAB — TSH: TSH: 2.245 u[IU]/mL (ref 0.350–4.500)

## 2023-10-17 LAB — HEPATIC FUNCTION PANEL
ALT: 25 U/L (ref 0–44)
AST: 23 U/L (ref 15–41)
Albumin: 4.1 g/dL (ref 3.5–5.0)
Alkaline Phosphatase: 57 U/L (ref 38–126)
Bilirubin, Direct: <0.1 mg/dL (ref 0.0–0.2)
Total Bilirubin: 0.7 mg/dL (ref 0.0–1.2)
Total Protein: 6.8 g/dL (ref 6.5–8.1)

## 2023-10-17 LAB — T4, FREE: Free T4: 1.17 ng/dL — ABNORMAL HIGH (ref 0.61–1.12)

## 2023-10-17 LAB — TROPONIN I (HIGH SENSITIVITY)
Troponin I (High Sensitivity): 3 ng/L (ref <18)
Troponin I (High Sensitivity): 3 ng/L (ref <18)

## 2023-10-17 MED ORDER — IOHEXOL 350 MG/ML SOLN
75.0000 mL | Freq: Once | INTRAVENOUS | Status: AC | PRN
  Administered 2023-10-17: 75 mL via INTRAVENOUS

## 2023-10-17 MED ORDER — SODIUM CHLORIDE 0.9 % IV BOLUS
1000.0000 mL | Freq: Once | INTRAVENOUS | Status: AC
  Administered 2023-10-17: 1000 mL via INTRAVENOUS

## 2023-10-17 MED ORDER — MECLIZINE HCL 25 MG PO TABS
25.0000 mg | ORAL_TABLET | Freq: Once | ORAL | Status: AC
Start: 2023-10-17 — End: 2023-10-17
  Administered 2023-10-17: 25 mg via ORAL
  Filled 2023-10-17: qty 1

## 2023-10-17 NOTE — ED Triage Notes (Signed)
 Pt to Ed via POV from home. Pt reports CP with exertion over the last several weeks that has been more persistent over the last few days. Pt reports left sided CP. Pt denies SOB. Pt also states intermittent HA and light headed.

## 2023-10-17 NOTE — ED Provider Notes (Signed)
 11:42 PM  Assumed care at shift change.

## 2023-10-17 NOTE — ED Notes (Signed)
 Patient transported to MRI

## 2023-10-17 NOTE — ED Notes (Signed)
 See triage notes. Patient c/o intermittent chest pain with exertion over the last several weeks. Patient just returned from three weeks in [place].

## 2023-10-17 NOTE — ED Notes (Signed)
 Mumma, MD notified this RN that she had got patient up to walk her around the room and the patient had a new onset of gait instability. Patient states that this is a new symptom that just started around 2000 today while she was in the ED. No weakness or facial droop observed at this time. Will continue to monitor.

## 2023-10-17 NOTE — ED Provider Notes (Signed)
 [facility] Provider Note    Event Date/Time   First MD Initiated Contact with Patient 10/17/23 1812     (approximate)   History   Chest Pain   HPI  Christine Richardson is a 68 y.o. female past medical history significant for thyroid disorder, who presents to the emergency department for not feeling well.  Patient states that she has been having exertional chest pain and shortness of breath over the past couple of weeks but has significantly worsened over the past day.  States that she just recently got back from a trip to [place].  Denies any swelling on her legs.  Denies any history of heart issues and has never had a stress test before.  No history of hypertension or hyperlipidemia.  No tobacco use.  No alcohol use.  Not on any OCPs but does take progesterone at nighttime for sleep.  States that she has been feeling very tired and fatigued and rundown.  Denies any dysuria, urinary urgency or frequency.  Denies fever or chills.     Physical Exam   Triage Vital Signs: ED Triage Vitals  Encounter Vitals Group     BP 10/17/23 1556 (!) 144/82     Systolic BP Percentile --      Diastolic BP Percentile --      Pulse Rate 10/17/23 1556 82     Resp 10/17/23 1559 16     Temp 10/17/23 1556 98.3 F (36.8 C)     Temp Source 10/17/23 1556 Oral     SpO2 10/17/23 1556 100 %     Weight --      Height --      Head Circumference --      Peak Flow --      Pain Score 10/17/23 1557 4     Pain Loc --      Pain Education --      Exclude from Growth Chart --     Most recent vital signs: Vitals:   10/17/23 2130 10/17/23 2200  BP: (!) 141/81 135/64  Pulse: (!) 45 68  Resp: 15 18  Temp:    SpO2: 98% 96%    Physical Exam Constitutional:      Appearance: She is well-developed.  HENT:     Head: Atraumatic.  Eyes:     Extraocular Movements: Extraocular movements intact.     Conjunctiva/sclera: Conjunctivae normal.     Pupils: Pupils are equal, round, and reactive to  light.     Comments: No nystagmus  Cardiovascular:     Rate and Rhythm: Regular rhythm.     Heart sounds: Normal heart sounds.  Pulmonary:     Effort: No respiratory distress.  Abdominal:     General: There is no distension.     Palpations: Abdomen is soft.     Tenderness: There is no abdominal tenderness.  Musculoskeletal:        General: Normal range of motion.     Cervical back: Normal range of motion.     Right lower leg: No tenderness. No edema.     Left lower leg: No edema.  Skin:    General: Skin is warm.     Capillary Refill: Capillary refill takes less than 2 seconds.  Neurological:     Mental Status: She is alert. Mental status is at baseline.     GCS: GCS eye subscore is 4. GCS verbal subscore is 5. GCS motor subscore is 6.     Cranial  Nerves: Cranial nerves 2-12 are intact.     Sensory: Sensation is intact.     Motor: Motor function is intact.     Coordination: Coordination is intact.     Gait: Gait abnormal.     IMPRESSION / MDM / ASSESSMENT AND PLAN / ED COURSE  I reviewed the triage vital signs and the nursing notes.  Differential diagnosis include ACS, anemia, hypothyroidism, dehydration, pulmonary embolism, infectious process, pneumonia  Does endorse a mild headache, denies any change in vision.  Has a nonfocal neurologic exam low suspicion for acute CVA.  EKG  I, Viviano Ground, the attending physician, personally viewed and interpreted this ECG.   Rate: Normal  Rhythm: Normal sinus  Axis: Normal  Intervals: Normal  ST&T Change: None  No tachycardic or bradycardic dysrhythmias while on cardiac telemetry.  RADIOLOGY I independently reviewed imaging, my interpretation of imaging: CTA with no signs of pulmonary embolism.  No signs of pneumonia.  No pulmonary edema.  CT scan of the head with no signs of intracranial hemorrhage or infarction.  LABS (all labs ordered are listed, but only abnormal results are displayed) Labs interpreted as -     Labs Reviewed  T4, FREE - Abnormal; Notable for the following components:      Result Value   Free T4 1.17 (*)    All other components within normal limits  URINALYSIS, W/ REFLEX TO CULTURE (INFECTION SUSPECTED) - Abnormal; Notable for the following components:   Color, Urine STRAW (*)    APPearance CLEAR (*)    Specific Gravity, Urine 1.033 (*)    Bacteria, UA RARE (*)    All other components within normal limits  BASIC METABOLIC PANEL WITH GFR  CBC  HEPATIC FUNCTION PANEL  LIPASE, BLOOD  TSH  TROPONIN I (HIGH SENSITIVITY)  TROPONIN I (HIGH SENSITIVITY)     MDM    Lab work overall reassuring.  Low risk heart score.  Serial troponins are negative have low suspicion for ACS.  No signs of pulmonary embolism or pneumonia.  Lab work is overall unremarkable.  Repeat vital signs patient is hypertensive with a blood pressure of 160/92 which is new for her.  Attempted to ambulate the patient and patient was feeling very off balance and dizzy and unable to walk in a straight line.  Orthostatic blood pressures were negative.  Continues to complain of ongoing severe fatigue.  Given her gait instability, hypertension and fatigue will further workup for acute CVA with MRI of the brain MRA.  On reevaluation repeat blood pressure significantly improved.  Patient does state that she is feeling better than she was previously.  Discussed with the MRI is negative and her gait has returned back to normal that she could follow-up closely as an outpatient with primary care physician and consider outpatient stress testing.  States that she has a follow-up appointment on Monday with her primary care physician but that she could call tomorrow to see if they can move up the appointment till tomorrow or Friday.  Discussed if ongoing gait instability would admit for further evaluation.  Care transferred to incoming provider with MRI pending   PROCEDURES:  Critical Care performed:  No  Procedures  Patient's presentation is most consistent with acute presentation with potential threat to life or bodily function.   MEDICATIONS ORDERED IN ED: Medications  iohexol (OMNIPAQUE) 350 MG/ML injection 75 mL (75 mLs Intravenous Contrast Given 10/17/23 1939)  sodium chloride  0.9 % bolus 1,000 mL (0 mLs Intravenous Stopped 10/17/23  2143)    FINAL CLINICAL IMPRESSION(S) / ED DIAGNOSES   Final diagnoses:  Chest pain, unspecified type  Dizzy  Nonintractable headache, unspecified chronicity pattern, unspecified headache type  Other fatigue     Rx / DC Orders   ED Discharge Orders     None        Note:  This document was prepared using Dragon voice recognition software and may include unintentional dictation errors.   Viviano Ground, MD 10/17/23 4098    Viviano Ground, MD 10/18/23 [id number]

## 2023-10-18 MED ORDER — ONDANSETRON 4 MG PO TBDP: 4.0000 mg | ORAL_TABLET | Freq: Four times a day (QID) | ORAL | 0 refills | Status: DC | PRN

## 2023-10-18 MED ORDER — MECLIZINE HCL 25 MG PO TABS: 25.0000 mg | ORAL_TABLET | Freq: Three times a day (TID) | ORAL | 0 refills | Status: AC | PRN

## 2023-10-18 NOTE — ED Notes (Signed)
 Patient tolerated PO challenge and ambulated to the bathroom with minimal assistance.

## 2023-10-18 NOTE — Discharge Instructions (Addendum)
 Your workup today has been reassuring with reassuring labs, normal CTA of the chest that rules out blood clots, normal MRI of the brain which showed no aneurysm or stroke.  I recommend rest, increase fluid intake and close follow-up with your primary care doctor in the next 1 to 2 days.  You may take over-the-counter Tylenol 1000 mg every 6 hours as needed for pain.  I have sent prescriptions for nausea medicine and medicine for dizziness to your pharmacy.

## 2023-10-24 ENCOUNTER — Other Ambulatory Visit: Payer: Self-pay | Admitting: Family Medicine

## 2023-10-24 DIAGNOSIS — R9389 Abnormal findings on diagnostic imaging of other specified body structures: Secondary | ICD-10-CM

## 2023-10-30 ENCOUNTER — Ambulatory Visit
Admission: RE | Admit: 2023-10-30 | Discharge: 2023-10-30 | Disposition: A | Discharge status: Home / Self Care | Source: Ambulatory Visit | Attending: Family Medicine | Admitting: Family Medicine

## 2023-10-30 DIAGNOSIS — R9389 Abnormal findings on diagnostic imaging of other specified body structures: Secondary | ICD-10-CM | POA: Insufficient documentation

## 2023-11-14 NOTE — Progress Notes (Signed)
 Evaluate for dissection and acute stroke/large vessel cutoff

## 2023-11-20 ENCOUNTER — Other Ambulatory Visit: Payer: Self-pay | Admitting: Family Medicine

## 2023-11-20 DIAGNOSIS — E785 Hyperlipidemia, unspecified: Secondary | ICD-10-CM

## 2023-11-22 DIAGNOSIS — R600 Localized edema: Secondary | ICD-10-CM | POA: Insufficient documentation

## 2023-11-27 DIAGNOSIS — G5701 Lesion of sciatic nerve, right lower limb: Secondary | ICD-10-CM | POA: Insufficient documentation

## 2023-11-29 ENCOUNTER — Ambulatory Visit
Admission: RE | Admit: 2023-11-29 | Discharge: 2023-11-29 | Disposition: A | Discharge status: Home / Self Care | Source: Ambulatory Visit | Attending: Family Medicine | Admitting: Family Medicine

## 2023-11-29 DIAGNOSIS — E785 Hyperlipidemia, unspecified: Secondary | ICD-10-CM | POA: Diagnosis present

## 2023-12-19 DIAGNOSIS — M19011 Primary osteoarthritis, right shoulder: Secondary | ICD-10-CM | POA: Insufficient documentation

## 2023-12-19 DIAGNOSIS — M249 Joint derangement, unspecified: Secondary | ICD-10-CM | POA: Insufficient documentation

## 2024-05-26 ENCOUNTER — Other Ambulatory Visit: Payer: Self-pay

## 2024-05-26 DIAGNOSIS — H04129 Dry eye syndrome of unspecified lacrimal gland: Secondary | ICD-10-CM | POA: Insufficient documentation

## 2024-05-26 DIAGNOSIS — D126 Benign neoplasm of colon, unspecified: Secondary | ICD-10-CM | POA: Insufficient documentation

## 2024-05-26 DIAGNOSIS — K649 Unspecified hemorrhoids: Secondary | ICD-10-CM | POA: Insufficient documentation

## 2024-05-26 DIAGNOSIS — K5730 Diverticulosis of large intestine without perforation or abscess without bleeding: Secondary | ICD-10-CM | POA: Insufficient documentation

## 2024-05-26 DIAGNOSIS — Z800 Family history of malignant neoplasm of digestive organs: Secondary | ICD-10-CM | POA: Insufficient documentation

## 2024-05-30 ENCOUNTER — Ambulatory Visit: Admitting: Family Medicine

## 2024-06-05 ENCOUNTER — Ambulatory Visit: Admitting: Family Medicine

## 2024-06-05 ENCOUNTER — Encounter: Payer: Self-pay | Admitting: Family Medicine

## 2024-06-05 VITALS — BP 114/75 | HR 76 | Temp 98.2°F | Ht 63.25 in | Wt 153.6 lb

## 2024-06-05 DIAGNOSIS — K30 Functional dyspepsia: Secondary | ICD-10-CM

## 2024-06-05 DIAGNOSIS — R6881 Early satiety: Secondary | ICD-10-CM | POA: Diagnosis not present

## 2024-06-05 DIAGNOSIS — R140 Abdominal distension (gaseous): Secondary | ICD-10-CM

## 2024-06-05 DIAGNOSIS — R143 Flatulence: Secondary | ICD-10-CM

## 2024-06-05 DIAGNOSIS — R197 Diarrhea, unspecified: Secondary | ICD-10-CM

## 2024-06-05 NOTE — Progress Notes (Signed)
 "   06/05/2024 Christine Richardson [id number] 07/06/1955  Gastroenterology Office Note    Referring Provider: Cindie Poe, MD Primary Care Physician:  Cindie Poe, MD  Primary GI Provider: Celestia Rima, NP; Jinny Carmine, MD    Chief Complaint   Chief Complaint  Patient presents with   New Patient (Initial Visit)    Diarrhea has improved- SIBO dx -took zifaxin- took probiotics-some relief- still having difficulty on what she can eat-continues to have bloating-     History of Present Illness   Christine Richardson is a 69 y.o. female with PMHX of diarrhea, colitis presenting today for follow up.   Discussed the use of AI scribe software for clinical note transcription with the patient, who gave verbal consent to proceed.   Colitis was diagnosed in November 2024 following colonoscopy and treated with budesonide. Subsequent hydrogen breath testing in February 2025 confirmed small intestinal bacterial overgrowth, with repeat testing in March 2025 after Xifaxan showing persistent elevated methane levels despite improved hydrogen levels. Some symptomatic improvement occurred after antibiotics, but gastrointestinal symptoms persisted.  Dietary modifications included a low FODMAP diet with nutritionist guidance, avoidance of trigger foods such as apples, beans, red meat, and dairy, Lactase/lactose-free products are used for dairy intolerance. During the summer of 2025, symptoms improved, but by August 2025, diarrhea worsened and persisted for several months, including nocturnal episodes and frequent morning bowel movements that limited her ability to leave the house. Over-the-counter remedies such as Pepto Bismol, Gas-X, and probiotics were tried. Diarrhea has since slowed, with current bowel movements described as two soft, but not formed, stools daily. She denies blood in stool, melena, or significant weight loss. She is not diabetic and has not had recent surgeries.  Significant difficulty  digesting food persists, with frequent gas, bloating, and episodes of heartburn or sour stomach occurring four to five days per week, often triggered by eating more than small portions. Cramping associated with bloating is typically localized to the lower abdomen and buttocks, without true abdominal pain. Nausea and vomiting are absent. Omeprazole has not been used for six months; Tums are used as needed for heartburn.   Patient seen by Ellouise Console, PA on 03/12/2023 for diarrhea and abnormal CT scan of the colon, questionable mild proximal colitis. Treated for collagenous colitis with budesonide.    04/11/2023 colonoscopy - The examined portion of the ileum was normal. Biopsied. NO SIGNIFICANT ABNORMALITY.NEGATIVE FOR INTRAEPITHELIAL LYMPHOCYTES, VILLOUS ATROPHY OR MALIGNANCY   - The entire examined colon is normal. Biopsied. COLONIC MUCOSA WITH A THICKENED SUBEPITHELIAL COLLAGEN BAND, INCREASED       INTRAEPITHELIAL LYMPHOCYTES AND SURFACE EPITHELIAL INJURY, CONSISTENT WITH COLLAGENOUS COLITIS. 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     Krill Oil (OMEGA-3) 500 MG CAPS Take by mouth.     levothyroxine (SYNTHROID) 75 MCG tablet Take 75 mcg by mouth daily before breakfast.     meclizine  (ANTIVERT ) 25 MG tablet Take 1 tablet (25 mg total) by mouth 3 (three) times daily as needed. 30 tablet 0   progesterone (ENDOMETRIN) 100 MG vaginal insert Place 100 mg vaginally 2 (two) times daily.     traZODone (DESYREL) 100 MG tablet Take 100 mg by mouth at bedtime.     vitamin B-12 (CYANOCOBALAMIN) 1000 MCG tablet Take 1,000 mcg by mouth daily.     Vitamin D, Cholecalciferol, 25 MCG (1000 UT) CAPS Take by mouth.     No current facility-administered medications for this visit.    Allergies as of 06/05/2024 - Review Complete 06/05/2024  Allergen Reaction Noted   Penicillins Rash, Hives, and Other (See Comments) 05/04/2021   Sulfa antibiotics Other (See Comments) 12/06/2017   Sulfate Rash 05/04/2021    Family History  Problem Relation Age of Onset   Colon cancer Mother    Rheum arthritis Mother     Social History   Socioeconomic History   Marital status: Married    Spouse name: Not on file   Number of children: Not on file   Years of education: Not on file   Highest education level: Not on file  Occupational History   Not on file  Tobacco Use   Smoking status: Never   Smokeless tobacco: Never  Vaping Use   Vaping status: Never Used  Substance and Sexual Activity   Alcohol use: Yes    Alcohol/week: 1.0 standard drink of alcohol    Types: 1 Glasses of wine per week   Drug use: Never   Sexual activity: Yes    Partners: Male  Other Topics Concern   Not on file  Social History Narrative   Not on file   Social Drivers of Health   Tobacco Use: Low Risk  (06/05/2024)   Patient History    Smoking Tobacco Use: Never    Smokeless Tobacco Use: Never    Passive Exposure: Not on file  Financial Resource Strain: Low Risk  (11/27/2023)   Received from [facility]   Overall Financial Resource Strain (CARDIA)    Difficulty of Paying Living Expenses: Not hard at all  Food Insecurity: No Food Insecurity (11/27/2023)   Received from [facility]   Epic    Within the past 12 months, you worried that your food would run out before you got the money to buy more.: Never true    Within the past 12 months, the food you bought just didn't last and you didn't have money to get more.: Never true  Transportation Needs: No Transportation Needs (11/27/2023)   Received from [facility] - Transportation    In the past 12 months, has lack of transportation kept you from medical appointments or from getting medications?: No    Lack of Transportation (Non-Medical): No  Physical Activity: Not on file  Stress: Not on file  Social Connections: Not on file  Intimate Partner Violence: Not  At Risk (11/07/2023)   Received from [facility]   Epic    Within the last year, have you been afraid of your partner or ex-partner?: No    Within the last year, have you been humiliated or emotionally abused in other ways by your partner or ex-partner?: No    Within the last year, have you been kicked, hit, slapped, or otherwise physically hurt by your partner or ex-partner?: No    Within the last year, have you been raped or forced to have any kind of sexual activity by your partner or ex-partner?: No  Depression (PHQ2-9): Not on file  Alcohol Screen: Not on file  Housing: Low Risk  (11/27/2023)   Received from [facility]   Epic    In the last 12 months, was there a time when you were not able to pay the mortgage or rent on time?: No    In the past 12 months, how many times have you moved where you were  living?: 0    At any time in the past 12 months, were you homeless or living in a shelter (including now)?: No  Utilities: Not At Risk (11/27/2023)   Received from [facility]   Epic    In the past 12 months has the electric, gas, oil, or water company threatened to shut off services in your home?: No  Health Literacy: Not on file     RELEVANT GI HISTORY, IMAGING AND LABS: CBC    Component Value Date/Time   WBC 7.2 10/17/2023 1559   RBC 4.76 10/17/2023 1559   HGB 14.7 10/17/2023 1559   HCT 42.6 10/17/2023 1559   PLT 226 10/17/2023 1559   MCV 89.5 10/17/2023 1559   MCH 30.9 10/17/2023 1559   MCHC 34.5 10/17/2023 1559   RDW 11.9 10/17/2023 1559   Recent Labs    10/17/23 1559  HGB 14.7    CMP     Component Value Date/Time   NA 143 10/17/2023 1559   K 4.3 10/17/2023 1559   CL 105 10/17/2023 1559   CO2 27 10/17/2023 1559   GLUCOSE 99 10/17/2023 1559   BUN 15 10/17/2023 1559   CREATININE 0.76 10/17/2023 1559   CALCIUM 9.5 10/17/2023 1559   PROT 6.8 10/17/2023 1559   ALBUMIN 4.1 10/17/2023 1559   AST 23 10/17/2023 1559   ALT 25 10/17/2023 1559   ALKPHOS 57 10/17/2023 1559   BILITOT 0.7 10/17/2023 1559   GFRNONAA >60 10/17/2023 1559      Latest Ref Rng & Units 10/17/2023    3:59 PM  Hepatic Function  Total Protein 6.5 - 8.1 g/dL 6.8   Albumin 3.5 - 5.0 g/dL 4.1   AST 15 - 41 U/L 23   ALT 0 - 44 U/L 25   Alk Phosphatase 38 - 126 U/L 57   Total Bilirubin 0.0 - 1.2 mg/dL 0.7   Bilirubin, Direct 0.0 - 0.2 mg/dL <9.8       Review of Systems   All systems reviewed and negative except where noted in HPI.    Physical Exam  BP 114/75   Pulse 76   Temp 98.2 F (36.8 C)   Ht 5' 3.25 (1.607 m)   Wt 153 lb 9.6 oz (69.7 kg)   SpO2 96%   BMI 26.99 kg/m  No LMP recorded. Patient is postmenopausal. General:   Alert and oriented. Pleasant and cooperative. Well-nourished and well-developed. NAD.  Head:  Normocephalic and atraumatic.  Eyes:  Without  icterus Ears:  Normal auditory acuity. Lungs:  Respirations even and unlabored.  Clear throughout to auscultation.   No wheezes, crackles, or rhonchi. No acute distress. Heart:  Regular rate and rhythm; no murmurs, clicks, rubs, or gallops. Abdomen:  Normal bowel sounds.  No bruits.  Soft, non-tender and non-distended without masses, hepatosplenomegaly or hernias noted.  No guarding or rebound tenderness.   Rectal:  Deferred. Msk:  Symmetrical without gross deformities. Normal posture. Extremities:  Without edema. Neurologic:  Alert and  oriented x4;  grossly normal neurologically. Skin:  Intact without significant lesions or rashes. Psych:  Alert and cooperative. Normal mood and affect.   Assessment & Plan   Luetta Piazza is a 69 y.o. female presenting today with dyspepsia  Functional dyspepsia with bloating, early satiety. History of SIBO.  - will recheck for SIBO - will check for h. pylori - Discussed possible gastric emptying study if other tests negative.  - Samples given of IBgard and FDgard - Recommended continuation of low FODMAP diet and avoidance of trigger foods.  Diarrhea. Has improved but stool still softer. Advised to increase fiber and increase fluids.    Follow up in 2 months   Grayce Bohr, DNP, AGNP-C [facility]  "

## 2024-06-06 ENCOUNTER — Encounter: Payer: Self-pay | Admitting: Family Medicine

## 2024-06-23 ENCOUNTER — Telehealth: Payer: Self-pay

## 2024-06-23 NOTE — Telephone Encounter (Signed)
 Called patient to let her know her SIBO test kit arrived at the office Friday and she can come pick it up- she is currently in [place]  and will pick it up on 07/09/24.

## 2024-07-15 ENCOUNTER — Ambulatory Visit: Admitting: Family Medicine

## 2024-08-04 ENCOUNTER — Ambulatory Visit: Admitting: Family Medicine
